# Patient Record
Sex: Male | Born: 1995 | Race: White | Hispanic: No | Marital: Single | Smoking: Never smoker
Health system: Southern US, Community
[De-identification: ages and names within clinical notes are randomized; demographics above are authoritative.]

## PROBLEM LIST (undated history)

## (undated) DIAGNOSIS — J45909 Unspecified asthma, uncomplicated: Secondary | ICD-10-CM

---
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
